# Patient Record
Sex: Female | Born: 1985 | Race: White | Hispanic: No | Marital: Married | Smoking: Current every day smoker
Health system: Southern US, Community
[De-identification: ages and names within clinical notes are randomized; demographics above are authoritative.]

## PROBLEM LIST (undated history)

## (undated) DIAGNOSIS — J45909 Unspecified asthma, uncomplicated: Secondary | ICD-10-CM

## (undated) HISTORY — PX: TONSILLECTOMY: SUR1361

---

## 2012-08-04 ENCOUNTER — Encounter (HOSPITAL_COMMUNITY): Payer: Self-pay | Admitting: *Deleted

## 2012-08-04 ENCOUNTER — Emergency Department (HOSPITAL_COMMUNITY)
Admission: EM | Admit: 2012-08-04 | Discharge: 2012-08-04 | Disposition: A | Discharge status: Home / Self Care | Payer: Self-pay | Attending: Emergency Medicine | Admitting: Emergency Medicine

## 2012-08-04 ENCOUNTER — Emergency Department (HOSPITAL_COMMUNITY): Payer: Self-pay

## 2012-08-04 DIAGNOSIS — N200 Calculus of kidney: Secondary | ICD-10-CM

## 2012-08-04 DIAGNOSIS — N201 Calculus of ureter: Secondary | ICD-10-CM | POA: Insufficient documentation

## 2012-08-04 DIAGNOSIS — R10819 Abdominal tenderness, unspecified site: Secondary | ICD-10-CM | POA: Insufficient documentation

## 2012-08-04 DIAGNOSIS — R109 Unspecified abdominal pain: Secondary | ICD-10-CM | POA: Insufficient documentation

## 2012-08-04 HISTORY — DX: Unspecified asthma, uncomplicated: J45.909

## 2012-08-04 LAB — URINALYSIS, ROUTINE W REFLEX MICROSCOPIC
Glucose, UA: NEGATIVE mg/dL
Leukocytes, UA: NEGATIVE
Protein, ur: 30 mg/dL — AB
Specific Gravity, Urine: 1.026 (ref 1.005–1.030)
pH: 5.5 (ref 5.0–8.0)

## 2012-08-04 LAB — COMPREHENSIVE METABOLIC PANEL
ALT: 10 U/L (ref 0–35)
AST: 13 U/L (ref 0–37)
CO2: 28 mEq/L (ref 19–32)
Chloride: 107 mEq/L (ref 96–112)
Creatinine, Ser: 0.78 mg/dL (ref 0.50–1.10)
GFR calc non Af Amer: >90 mL/min (ref >90)
Total Bilirubin: 0.2 mg/dL — ABNORMAL LOW (ref 0.3–1.2)

## 2012-08-04 LAB — CBC WITH DIFFERENTIAL/PLATELET
Basophils Absolute: 0 10*3/uL (ref 0.0–0.1)
HCT: 38.4 % (ref 36.0–46.0)
Hemoglobin: 13.3 g/dL (ref 12.0–15.0)
Lymphocytes Relative: 15 % (ref 12–46)
Monocytes Absolute: 0.4 10*3/uL (ref 0.1–1.0)
Neutro Abs: 7.1 10*3/uL (ref 1.7–7.7)
RBC: 4.47 MIL/uL (ref 3.87–5.11)
RDW: 12.2 % (ref 11.5–15.5)
WBC: 9 10*3/uL (ref 4.0–10.5)

## 2012-08-04 LAB — URINE MICROSCOPIC-ADD ON

## 2012-08-04 MED ORDER — OXYCODONE-ACETAMINOPHEN 5-325 MG PO TABS
1.0000 | ORAL_TABLET | Freq: Once | ORAL | Status: AC
  Administered 2012-08-04: 1 via ORAL
  Filled 2012-08-04: qty 1

## 2012-08-04 MED ORDER — MORPHINE SULFATE 4 MG/ML IJ SOLN
4.0000 mg | Freq: Once | INTRAMUSCULAR | Status: AC
  Administered 2012-08-04: 4 mg via INTRAVENOUS
  Filled 2012-08-04: qty 1

## 2012-08-04 MED ORDER — PROMETHAZINE HCL 25 MG PO TABS: 25.0000 mg | ORAL_TABLET | Freq: Four times a day (QID) | ORAL | Status: AC | PRN

## 2012-08-04 MED ORDER — OXYCODONE-ACETAMINOPHEN 5-325 MG PO TABS: 2.0000 | ORAL_TABLET | ORAL | Status: AC | PRN

## 2012-08-04 MED ORDER — ONDANSETRON 8 MG PO TBDP
8.0000 mg | ORAL_TABLET | Freq: Once | ORAL | Status: AC
  Administered 2012-08-04: 8 mg via ORAL
  Filled 2012-08-04: qty 1

## 2012-08-04 MED ORDER — TAMSULOSIN HCL 0.4 MG PO CAPS: 0.4000 mg | ORAL_CAPSULE | Freq: Every day | ORAL | Status: AC

## 2012-08-04 MED ORDER — SODIUM CHLORIDE 0.9 % IV SOLN
Freq: Once | INTRAVENOUS | Status: AC
  Administered 2012-08-04: 20 mL/h via INTRAVENOUS

## 2012-08-04 MED ORDER — ONDANSETRON HCL 4 MG/2ML IJ SOLN
4.0000 mg | Freq: Once | INTRAMUSCULAR | Status: AC
  Administered 2012-08-04: 4 mg via INTRAVENOUS
  Filled 2012-08-04: qty 2

## 2012-08-04 NOTE — ED Notes (Signed)
Pt reports vomiting after PO medication.

## 2012-08-04 NOTE — ED Notes (Signed)
Pt verbalizes understanding 

## 2012-08-04 NOTE — ED Notes (Signed)
Pt c/o left back/flank pain that started today. Reports no hx of same. Also reports urinary frequency. Denies hematuria or dysuria. Pt also reports vomiting x's 2 today.

## 2012-08-04 NOTE — ED Provider Notes (Signed)
History     CSN: [id number]  Arrival date & time 08/04/12  1858   First MD Initiated Contact with Patient 08/04/12 2020      Chief Complaint  Patient presents with  . Flank Pain    (Consider location/radiation/quality/duration/timing/severity/associated sxs/prior treatment) HPI History provided by pt.   Pt has had gradually worsening pain in her right flank that radiates to right lower abdomen since this morning.  Associated w/ N/V and increased urinary frequency.  Denies fever, diarrhea and other GU sx.  Has a h/o pyelonephritis but this pain is worse.  Denies recent trauma and heavy lifting.   Past Medical History  Diagnosis Date  . Asthma     Past Surgical History  Procedure Date  . Tonsillectomy     History reviewed. No pertinent family history.  History  Substance Use Topics  . Smoking status: Current Every Day Smoker    Types: Cigarettes  . Smokeless tobacco: Not on file  . Alcohol Use: No    OB History    Grav Para Term Preterm Abortions TAB SAB Ect Mult Living                  Review of Systems  All other systems reviewed and are negative.    Allergies  Toradol and Tramadol  Home Medications   Current Outpatient Rx  Name Route Sig Dispense Refill  . BUPRENORPHINE HCL 8 MG SL SUBL Sublingual Place 8 mg under the tongue daily.    . IBUPROFEN 200 MG PO TABS Oral Take 800 mg by mouth every 6 (six) hours as needed.      BP 128/70  Pulse 56  Temp 98.2 F (36.8 C) (Oral)  Resp 18  Ht 5\' 5"  (1.651 m)  Wt 122 lb (55.339 kg)  BMI 20.30 kg/m2  SpO2 100%  LMP 07/05/2012  Physical Exam  Nursing note and vitals reviewed. Constitutional: She is oriented to person, place, and time. She appears well-developed and well-nourished.       Uncomfortable appearing  HENT:  Head: Normocephalic and atraumatic.  Eyes:       Normal appearance  Neck: Normal range of motion.  Cardiovascular: Normal rate and regular rhythm.   Pulmonary/Chest: Effort normal  and breath sounds normal. No respiratory distress.  Abdominal: Soft. Bowel sounds are normal. She exhibits no distension and no mass. There is no rebound and no guarding.       Diffuse right-sided abd and suprapubic ttp  Genitourinary:       R CVA tenderness  Musculoskeletal: Normal range of motion.  Neurological: She is alert and oriented to person, place, and time.  Skin: Skin is warm and dry. No rash noted.  Psychiatric: She has a normal mood and affect. Her behavior is normal.    ED Course  Procedures (including critical care time)  Labs Reviewed  URINALYSIS, ROUTINE W REFLEX MICROSCOPIC - Abnormal; Notable for the following:    APPearance CLOUDY (*)     Hgb urine dipstick LARGE (*)     Protein, ur 30 (*)     All other components within normal limits  URINE MICROSCOPIC-ADD ON - Abnormal; Notable for the following:    Squamous Epithelial / LPF FEW (*)     Bacteria, UA FEW (*)     All other components within normal limits  POCT PREGNANCY, URINE  CBC WITH DIFFERENTIAL  COMPREHENSIVE METABOLIC PANEL  LIPASE, BLOOD   No results found.   1. Kidney stone  MDM  26yo F w/ h/o pyelonephritis and hematuria, presents w/ severe, non-traumatic R flank pain + N/V since this morning.  On exam, uncomfortable appearing, right-sided abd, suprapubic and R CVA ttp.  Pt reports that she threw up the percocet that was given to her here in ED and she is allergic to toradol and tramadol.  IV morphine and zofran ordered but I am slightly suspicious of malingering.  U/A pos for hgb; no infection.  CT abd/pelvis ordered to r/o nephrolithiasis.  8:51 PM   CT shows 7mm stone right distal ureter.  Pt reports that her pain is improved.  She is tolerating pos.  She requests that she be discharged with a note stating all medications she received in ED and was prescribed.  Is in an outpatient treatment program for narcotic abuse.  Referred to urology but pt from out of town.  Return precautions  discussed.       Otilio Miu, [place] 08/05/12 [phone]

## 2012-08-05 NOTE — ED Provider Notes (Signed)
Medical screening examination/treatment/procedure(s) were performed by non-physician practitioner and as supervising physician I was immediately available for consultation/collaboration.   Daielle Melcher, MD 08/05/12 2302 

## 2014-02-03 IMAGING — CT CT ABD-PELV W/O CM
1 series · 15 of 32 positions shown, 19 images · non-contrast
Comparison: None.

CLINICAL DATA: Left back and flank pain.  Urinary frequency.
Vomiting.

CT ABDOMEN AND PELVIS WITHOUT CONTRAST
TECHNIQUE: Multidetector CT imaging of the abdomen and pelvis was
performed following the standard protocol without intravenous
contrast.

[Series 6: lung · axial · 0.74mm/px · z∈[-213,-53]mm · 15 of 37 slices shown, 19 images]
[im 3/37  soft-tissue]
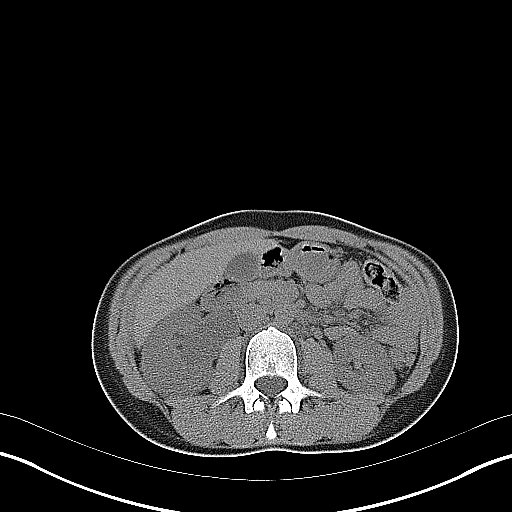
[im 3/37  bone]
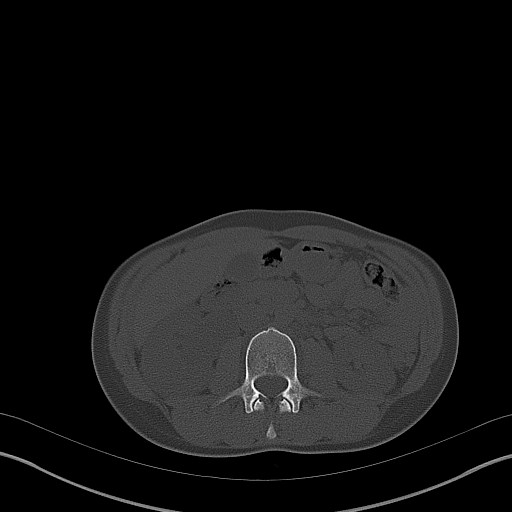
[im 5/37  soft-tissue]
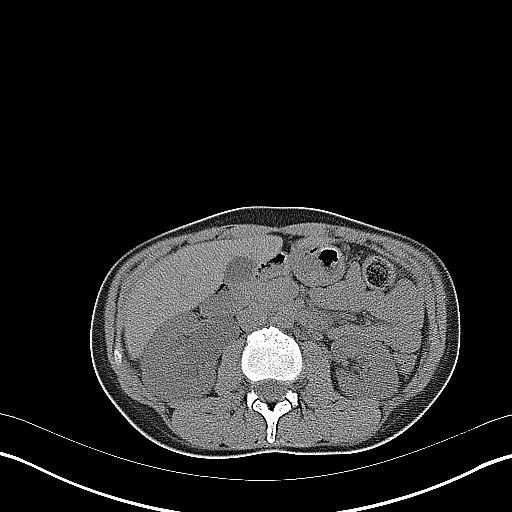
[im 7/37  soft-tissue]
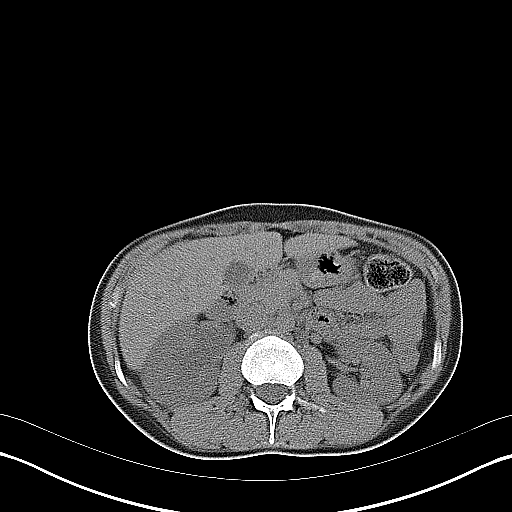
[im 11/37  soft-tissue]
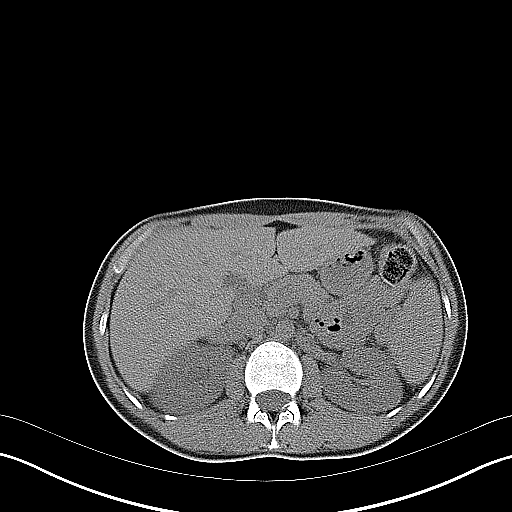
[im 13/37  soft-tissue]
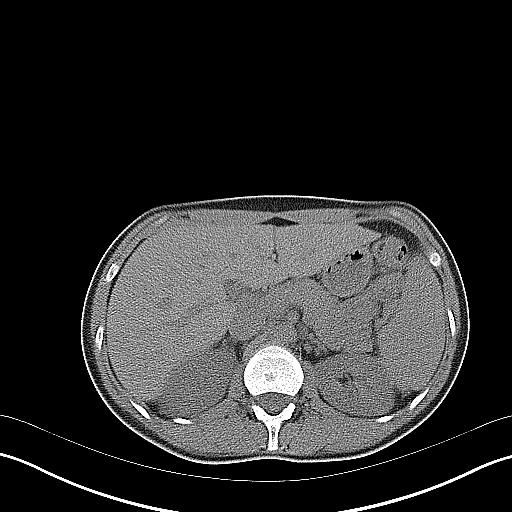
[im 16/37  soft-tissue]
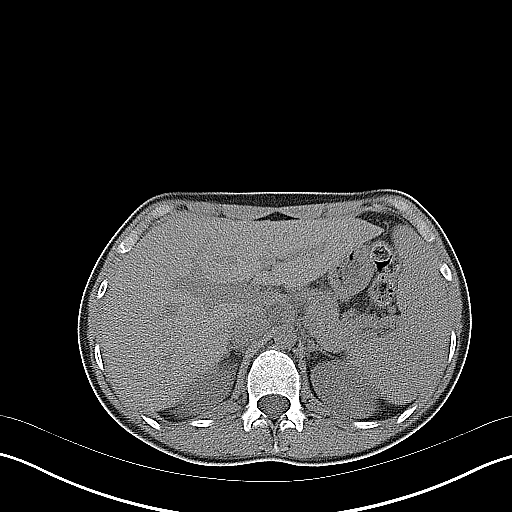
[im 19/37  soft-tissue]
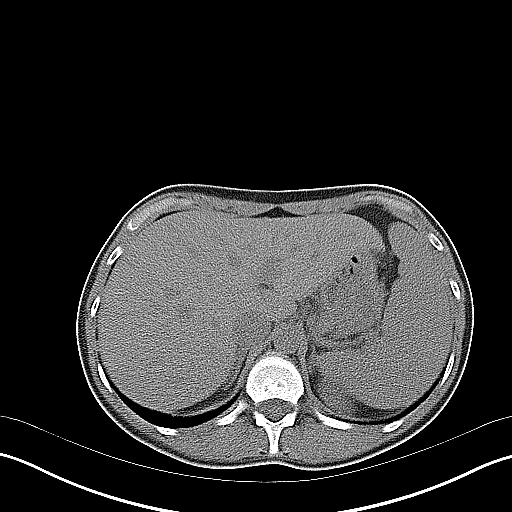
[im 21/37  soft-tissue]
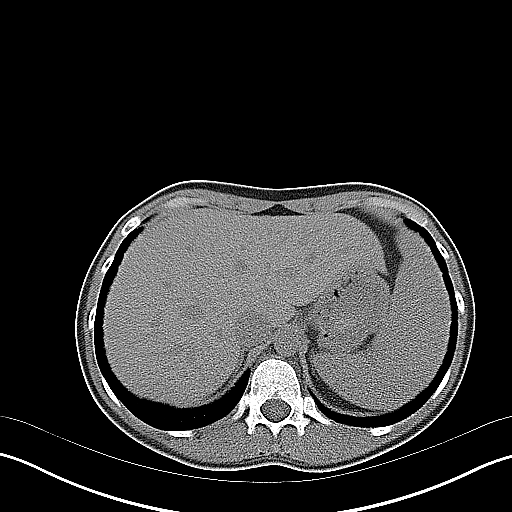
[im 24/37  soft-tissue]
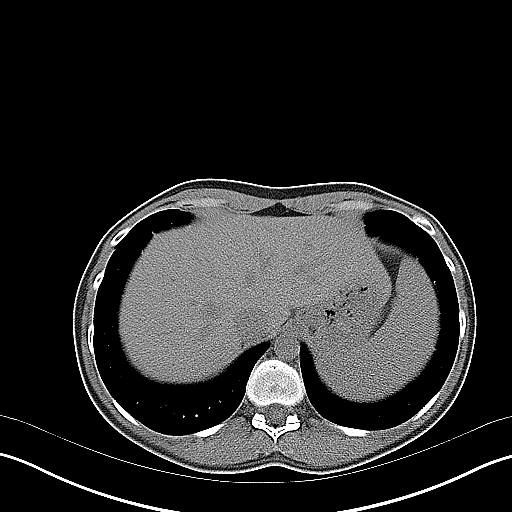
[im 24/37  bone]
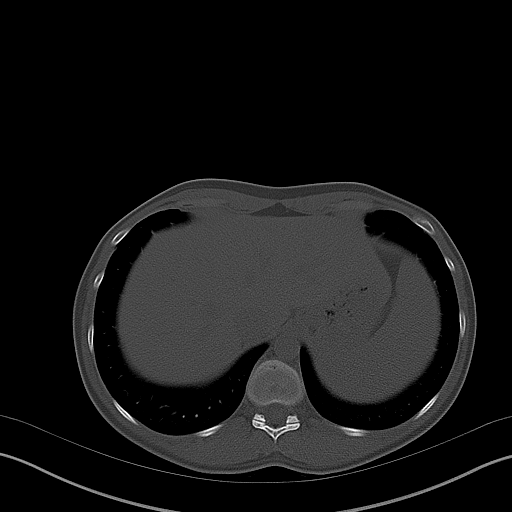
[im 26/37  soft-tissue]
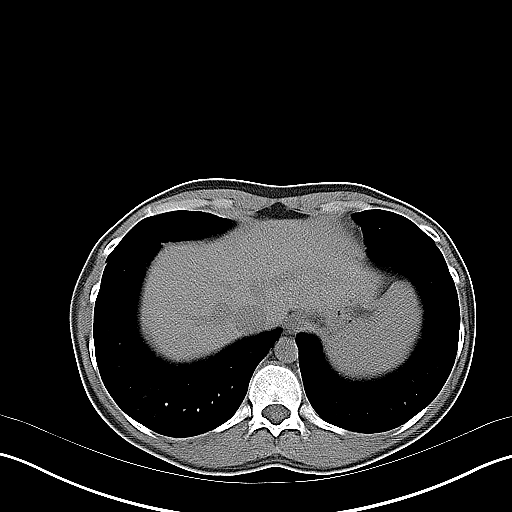
[im 30/37  soft-tissue]
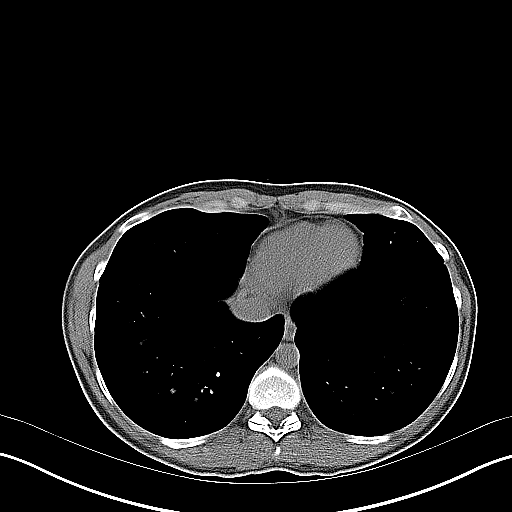
[im 32/37  soft-tissue]
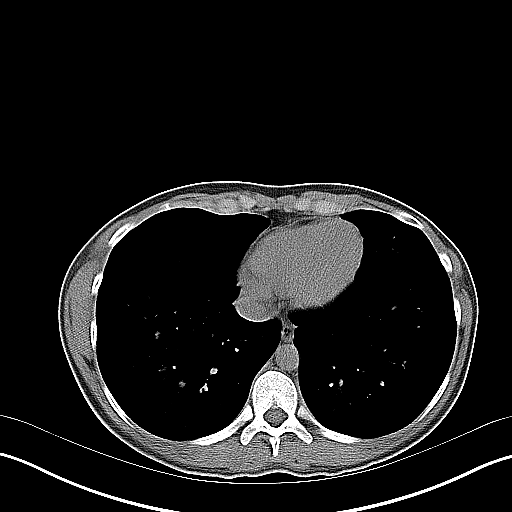
[im 32/37  lung]
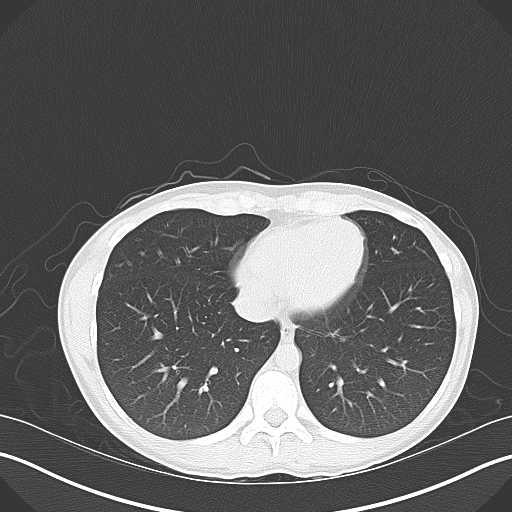
[im 33/37  lung]
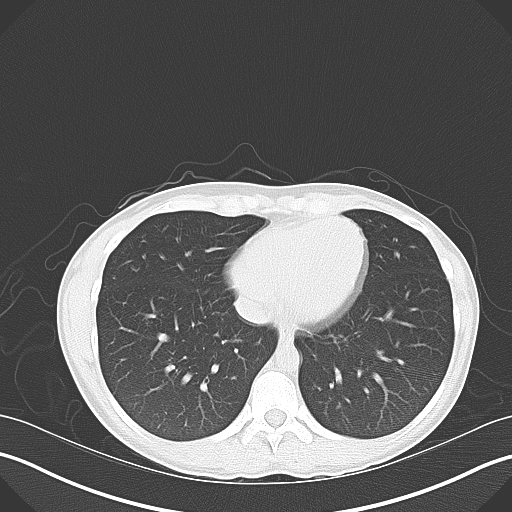
[im 34/37  soft-tissue]
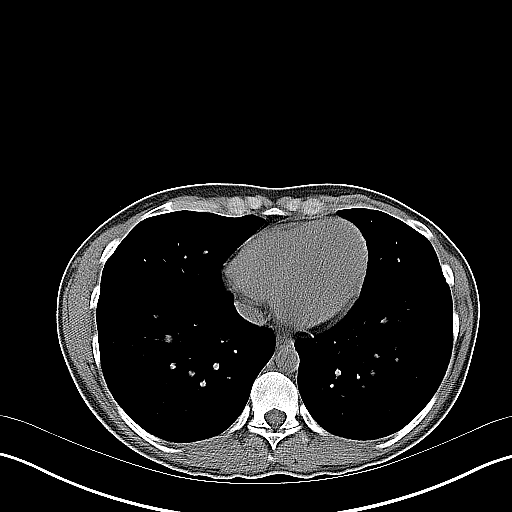
[im 34/37  lung]
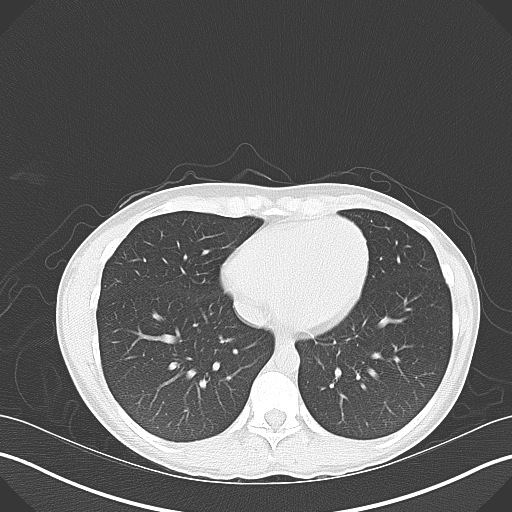
[im 35/37  lung]
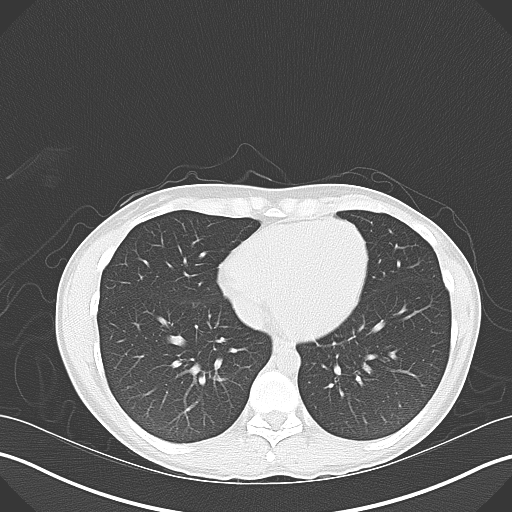

[15 of 32 positions shown; findings below may reference images not displayed]

FINDINGS: The lung bases are clear.

There is a 7 mm stone in the distal right ureter at the
ureterovesicle junction.  There is marked right pyelocaliectasis
and ureterectasis.  Changes are consistent with significant
obstruction.  The left kidney is unremarkable.  No left renal or
ureteral stones are demonstrated.  No left-sided pyelocaliectasis
or ureterectasis.  The bladder wall is not thickened and no bladder
stones are appreciated.

The unenhanced appearance of the liver, spleen, gallbladder,
pancreas, adrenal glands, abdominal aorta, and retroperitoneal
lymph nodes is unremarkable.  The stomach, small bowel, and colon
are not abnormally distended.  No free air or free fluid in the
abdomen.

Pelvis:  No free or loculated pelvic fluid collections.  The uterus
and ovaries are not significantly enlarged.  The appendix is
normal.  No diverticulitis.  No significant pelvic lymphadenopathy.
Normal alignment of the lumbar vertebrae with limbus vertebrae at
L2 and L3.  Schmorl's nodes.
IMPRESSION: 7 ml stone in the distal right ureter with prominent right
hydroureter and hydronephrosis.

## 2017-03-28 ENCOUNTER — Emergency Department (HOSPITAL_COMMUNITY)
Admission: EM | Admit: 2017-03-28 | Discharge: 2017-03-29 | Disposition: A | Discharge status: Home / Self Care | Payer: Self-pay | Attending: Emergency Medicine | Admitting: Emergency Medicine

## 2017-03-28 ENCOUNTER — Encounter (HOSPITAL_COMMUNITY): Payer: Self-pay | Admitting: *Deleted

## 2017-03-28 DIAGNOSIS — Z5321 Procedure and treatment not carried out due to patient leaving prior to being seen by health care provider: Secondary | ICD-10-CM | POA: Insufficient documentation

## 2017-03-28 DIAGNOSIS — R109 Unspecified abdominal pain: Secondary | ICD-10-CM | POA: Insufficient documentation

## 2017-03-28 MED ORDER — OXYCODONE-ACETAMINOPHEN 5-325 MG PO TABS
1.0000 | ORAL_TABLET | ORAL | Status: DC | PRN
  Administered 2017-03-28: 1 via ORAL
  Filled 2017-03-28: qty 1

## 2017-03-28 NOTE — ED Triage Notes (Signed)
Pt complains of right sided groin pain radiated to her right flank since this morning. Pt states she feels she has a kidney stone. Pt has hx of kidney stones.

## 2017-03-28 NOTE — ED Notes (Signed)
Patient called to be bedded and no answer.

## 2017-03-28 NOTE — ED Notes (Signed)
Patient called to be bedded and no answer.
